# Patient Record
Sex: Male | Born: 2000 | Race: Black or African American | Hispanic: No | Marital: Single | Smoking: Never smoker
Health system: Southern US, Community
[De-identification: ages and names within clinical notes are randomized; demographics above are authoritative.]

## PROBLEM LIST (undated history)

## (undated) HISTORY — PX: HERNIA REPAIR: SHX51

---
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
## 2019-02-22 ENCOUNTER — Ambulatory Visit: Payer: Self-pay

## 2019-02-23 ENCOUNTER — Ambulatory Visit: Payer: Self-pay

## 2019-05-21 ENCOUNTER — Other Ambulatory Visit: Payer: Self-pay

## 2019-05-21 ENCOUNTER — Emergency Department
Admission: EM | Admit: 2019-05-21 | Discharge: 2019-05-21 | Disposition: A | Discharge status: Home / Self Care | Payer: Self-pay | Attending: Emergency Medicine | Admitting: Emergency Medicine

## 2019-05-21 ENCOUNTER — Encounter: Payer: Self-pay | Admitting: *Deleted

## 2019-05-21 ENCOUNTER — Emergency Department: Payer: Self-pay

## 2019-05-21 DIAGNOSIS — S43005A Unspecified dislocation of left shoulder joint, initial encounter: Secondary | ICD-10-CM

## 2019-05-21 DIAGNOSIS — S43015A Anterior dislocation of left humerus, initial encounter: Secondary | ICD-10-CM | POA: Insufficient documentation

## 2019-05-21 DIAGNOSIS — Y9361 Activity, american tackle football: Secondary | ICD-10-CM | POA: Insufficient documentation

## 2019-05-21 DIAGNOSIS — Y92321 Football field as the place of occurrence of the external cause: Secondary | ICD-10-CM | POA: Insufficient documentation

## 2019-05-21 DIAGNOSIS — X501XXA Overexertion from prolonged static or awkward postures, initial encounter: Secondary | ICD-10-CM | POA: Insufficient documentation

## 2019-05-21 DIAGNOSIS — Y999 Unspecified external cause status: Secondary | ICD-10-CM | POA: Insufficient documentation

## 2019-05-21 MED ORDER — MORPHINE SULFATE (PF) 4 MG/ML IV SOLN
4.0000 mg | Freq: Once | INTRAVENOUS | Status: AC
  Administered 2019-05-21: 4 mg via INTRAVENOUS
  Filled 2019-05-21: qty 1

## 2019-05-21 NOTE — ED Notes (Signed)
Patient transported to X-ray 

## 2019-05-21 NOTE — ED Notes (Signed)
MD at bedside. 

## 2019-05-21 NOTE — ED Notes (Signed)
Pt verbalized understanding of discharge instructions. NAD at this time. 

## 2019-05-21 NOTE — ED Triage Notes (Signed)
Pt to ED from home after playing football, falling and dislocating his left shoulder. Pt reports this has happened three times in the past but this is the first time he has not been able to relocate it on his own. 8/10 pain with decreased movement. Radial pulse intact and equal bilaterally. Color of arm is appropriate and cap refill < 3sec.

## 2019-05-21 NOTE — ED Provider Notes (Signed)
[facility] Emergency Department Provider Note   ____________________________________________   First MD Initiated Contact with Patient 05/21/19 1556     (approximate)  I have reviewed the triage vital signs and the nursing notes.   HISTORY  Chief Complaint Shoulder Injury    HPI Jeremiah Gomez is a 18 y.o. male with no significant past medical history who presents to the ED complaining of shoulder injury.  Patient reports that he was reaching with his left arm to swat a football out of the air when he felt a sudden pop.  It felt like the 3 prior occasions where he has dislocated his left shoulder, however in the past he has been able to relocate it on his own and was not able to do so today.  Initial injury occurred about 1 hour prior to arrival and he denies any significant pain anywhere other than his shoulder.  He has had full range of motion at his elbow and wrist and denies any numbness.        History reviewed. No pertinent past medical history.  Patient Active Problem List   Diagnosis Date Noted  . Inguinal hernia, left 02/07/2013    Past Surgical History:  Procedure Laterality Date  . HERNIA REPAIR     umbilical hernia baby    Prior to Admission medications   Not on File    Allergies Patient has no known allergies.  History reviewed. No pertinent family history.  Social History Social History   Tobacco Use  . Smoking status: Never Smoker  . Smokeless tobacco: Never Used  Substance Use Topics  . Alcohol use: No  . Drug use: No    Review of Systems  Constitutional: No fever/chills Eyes: No visual changes. ENT: No sore throat. Cardiovascular: Denies chest pain. Respiratory: Denies shortness of breath. Gastrointestinal: No abdominal pain.  No nausea, no vomiting.  No diarrhea.  No constipation. Genitourinary: Negative for dysuria. Musculoskeletal: Negative for back pain.  Positive for left shoulder pain and  dislocation. Skin: Negative for rash. Neurological: Negative for headaches, focal weakness or numbness.  ____________________________________________   PHYSICAL EXAM:  VITAL SIGNS: ED Triage Vitals  Enc Vitals Group     BP      Pulse      Resp      Temp      Temp src      SpO2      Weight      Height      Head Circumference      Peak Flow      Pain Score      Pain Loc      Pain Edu?      Excl. in GC?     Constitutional: Alert and oriented. Eyes: Conjunctivae are normal. Head: Atraumatic. Nose: No congestion/rhinnorhea. Mouth/Throat: Mucous membranes are moist. Neck: Normal ROM Cardiovascular: Normal rate, regular rhythm. Grossly normal heart sounds. Respiratory: Normal respiratory effort.  No retractions. Lungs CTAB. Gastrointestinal: Soft and nontender. No distention. Genitourinary: deferred Musculoskeletal: No lower extremity tenderness nor edema.  Obvious deformity of left shoulder.  Range of motion intact to left elbow and left wrist without tenderness. Neurologic:  Normal speech and language. No gross focal neurologic deficits are appreciated.  Strength and sensation intact at left elbow and left wrist. Skin:  Skin is warm, dry and intact. No rash noted. Psychiatric: Mood and affect are normal. Speech and behavior are normal.  ____________________________________________   LABS (all labs ordered are listed, but  only abnormal results are displayed)  Labs Reviewed - No data to display   PROCEDURES  Procedure(s) performed (including Critical Care):  Procedures   ____________________________________________   INITIAL IMPRESSION / ASSESSMENT AND PLAN / ED COURSE       18 year old male with history of prior left shoulder dislocations presents to the ED with obvious deformity to left shoulder after reaching to swat a football out of the air.  Deformity appears consistent with anterior dislocation of left shoulder.  He is neurovascularly intact distal to  the injury with good radial pulses and intact strength and sensation.  Will give pain control and x-ray left shoulder, attempt reduction.  Patient's initial x-rays showed anterior dislocation, which spontaneously reduced with positioning for additional films.  Follow-up x-rays confirm appropriate post reduction positioning with no evidence of fracture.  Patient was placed in left shoulder immobilizer and remains neurovascular intact.  He was counseled to follow-up with orthopedic surgery and to return to the ED for new or worsening symptoms, patient agrees with plan.      ____________________________________________   FINAL CLINICAL IMPRESSION(S) / ED DIAGNOSES  Final diagnoses:  Dislocation of left shoulder joint, initial encounter     ED Discharge Orders    None       Note:  This document was prepared using Dragon voice recognition software and may include unintentional dictation errors.   Blake Divine, MD 05/21/19 [phone]

## 2020-01-20 ENCOUNTER — Emergency Department
Admission: EM | Admit: 2020-01-20 | Discharge: 2020-01-20 | Disposition: A | Discharge status: Home / Self Care | Payer: No Typology Code available for payment source | Attending: Emergency Medicine | Admitting: Emergency Medicine

## 2020-01-20 ENCOUNTER — Other Ambulatory Visit: Payer: Self-pay

## 2020-01-20 ENCOUNTER — Emergency Department: Payer: No Typology Code available for payment source

## 2020-01-20 DIAGNOSIS — Y9389 Activity, other specified: Secondary | ICD-10-CM | POA: Diagnosis not present

## 2020-01-20 DIAGNOSIS — Y92410 Unspecified street and highway as the place of occurrence of the external cause: Secondary | ICD-10-CM | POA: Diagnosis not present

## 2020-01-20 DIAGNOSIS — Z23 Encounter for immunization: Secondary | ICD-10-CM | POA: Diagnosis not present

## 2020-01-20 DIAGNOSIS — M542 Cervicalgia: Secondary | ICD-10-CM | POA: Insufficient documentation

## 2020-01-20 DIAGNOSIS — S0990XA Unspecified injury of head, initial encounter: Secondary | ICD-10-CM | POA: Diagnosis not present

## 2020-01-20 DIAGNOSIS — Y998 Other external cause status: Secondary | ICD-10-CM | POA: Insufficient documentation

## 2020-01-20 DIAGNOSIS — R519 Headache, unspecified: Secondary | ICD-10-CM | POA: Diagnosis not present

## 2020-01-20 MED ORDER — TETANUS-DIPHTH-ACELL PERTUSSIS 5-2.5-18.5 LF-MCG/0.5 IM SUSP
0.5000 mL | Freq: Once | INTRAMUSCULAR | Status: AC
  Administered 2020-01-20: 0.5 mL via INTRAMUSCULAR
  Filled 2020-01-20: qty 0.5
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
## 2020-01-20 NOTE — ED Notes (Signed)
No peripheral IV placed this visit.   Discharge instructions reviewed with patient. Questions fielded by this RN. Patient verbalizes understanding of instructions. Patient discharged home in stable condition per Darl Pikes. No acute distress noted at time of discharge.

## 2020-01-20 NOTE — Discharge Instructions (Addendum)
Up with your regular doctor if not improving in 5 to 7 weeks.  And worsening headache or altered neurologic status please return the emergency department.

## 2020-05-13 ENCOUNTER — Emergency Department
Admission: EM | Admit: 2020-05-13 | Discharge: 2020-05-13 | Disposition: A | Discharge status: Home / Self Care | Payer: Medicaid Other | Attending: Student in an Organized Health Care Education/Training Program | Admitting: Student in an Organized Health Care Education/Training Program

## 2020-05-13 ENCOUNTER — Other Ambulatory Visit: Payer: Self-pay

## 2020-05-13 DIAGNOSIS — R109 Unspecified abdominal pain: Secondary | ICD-10-CM | POA: Insufficient documentation

## 2020-05-13 DIAGNOSIS — Z20822 Contact with and (suspected) exposure to covid-19: Secondary | ICD-10-CM | POA: Insufficient documentation

## 2020-05-13 DIAGNOSIS — K529 Noninfective gastroenteritis and colitis, unspecified: Secondary | ICD-10-CM

## 2020-05-13 LAB — COMPREHENSIVE METABOLIC PANEL
ALT: 15 U/L (ref 0–44)
AST: 22 U/L (ref 15–41)
Albumin: 4.8 g/dL (ref 3.5–5.0)
Alkaline Phosphatase: 70 U/L (ref 38–126)
Anion gap: 11 (ref 5–15)
BUN: 11 mg/dL (ref 6–20)
CO2: 27 mmol/L (ref 22–32)
Calcium: 9.4 mg/dL (ref 8.9–10.3)
Chloride: 102 mmol/L (ref 98–111)
Creatinine, Ser: 1.09 mg/dL (ref 0.61–1.24)
GFR, Estimated: >60 mL/min (ref >60)
Glucose, Bld: 110 mg/dL — ABNORMAL HIGH (ref 70–99)
Potassium: 3.9 mmol/L (ref 3.5–5.1)
Sodium: 140 mmol/L (ref 135–145)
Total Bilirubin: 1.6 mg/dL — ABNORMAL HIGH (ref 0.3–1.2)
Total Protein: 8.4 g/dL — ABNORMAL HIGH (ref 6.5–8.1)

## 2020-05-13 LAB — URINALYSIS, COMPLETE (UACMP) WITH MICROSCOPIC
Bacteria, UA: NONE SEEN
Bilirubin Urine: NEGATIVE
Glucose, UA: NEGATIVE mg/dL
Hgb urine dipstick: NEGATIVE
Ketones, ur: NEGATIVE mg/dL
Leukocytes,Ua: NEGATIVE
Nitrite: NEGATIVE
Protein, ur: NEGATIVE mg/dL
Specific Gravity, Urine: 1.026 (ref 1.005–1.030)
Squamous Epithelial / HPF: NONE SEEN (ref 0–5)
pH: 5 (ref 5.0–8.0)

## 2020-05-13 LAB — CBC
HCT: 47.7 % (ref 39.0–52.0)
Hemoglobin: 16.3 g/dL (ref 13.0–17.0)
MCH: 30.4 pg (ref 26.0–34.0)
MCHC: 34.2 g/dL (ref 30.0–36.0)
MCV: 89 fL (ref 80.0–100.0)
Platelets: 218 10*3/uL (ref 150–400)
RBC: 5.36 MIL/uL (ref 4.22–5.81)
RDW: 12.6 % (ref 11.5–15.5)
WBC: 13.2 10*3/uL — ABNORMAL HIGH (ref 4.0–10.5)
nRBC: 0 % (ref 0.0–0.2)

## 2020-05-13 LAB — LIPASE, BLOOD: Lipase: 29 U/L (ref 11–51)

## 2020-05-13 LAB — RESP PANEL BY RT-PCR (FLU A&B, COVID) ARPGX2
Influenza A by PCR: NEGATIVE
Influenza B by PCR: NEGATIVE
SARS Coronavirus 2 by RT PCR: NEGATIVE

## 2020-05-13 MED ORDER — ONDANSETRON 4 MG PO TBDP
4.0000 mg | ORAL_TABLET | Freq: Once | ORAL | Status: AC
  Administered 2020-05-13: 4 mg via ORAL
  Filled 2020-05-13: qty 1

## 2020-05-13 MED ORDER — ONDANSETRON HCL 4 MG PO TABS: 4.0000 mg | ORAL_TABLET | Freq: Three times a day (TID) | ORAL | 0 refills | Status: DC | PRN

## 2020-05-13 NOTE — ED Triage Notes (Signed)
Pt here with N/V/D that started today along with abd pain. Pt denies any other symptoms.

## 2020-05-13 NOTE — ED Provider Notes (Signed)
[facility] Emergency Department Provider Note ____________________________________________   First MD Initiated Contact with Patient 05/13/20 2223     (approximate)  I have reviewed the triage vital signs and the nursing notes.   HISTORY  Chief Complaint Emesis  HPI Jeremiah Gomez is a 19 y.o. male with no chronic medical history presents to the emergency department for treatment and evaluation of nausea, vomiting, and diarrhea that started earlier today.     No past medical history on file.  Patient Active Problem List   Diagnosis Date Noted  . Inguinal hernia, left 02/07/2013    Past Surgical History:  Procedure Laterality Date  . HERNIA REPAIR     umbilical hernia baby    Prior to Admission medications   Medication Sig Start Date End Date Taking? Authorizing Provider  ondansetron (ZOFRAN) 4 MG tablet Take 1 tablet (4 mg total) by mouth every 8 (eight) hours as needed for nausea or vomiting. 05/13/20   Chinita Pester, FNP    Allergies Patient has no known allergies.  No family history on file.  Social History Social History   Tobacco Use  . Smoking status: Never Smoker  . Smokeless tobacco: Never Used  Substance Use Topics  . Alcohol use: No  . Drug use: No    Review of Systems  Constitutional: No fever/chills Eyes: No visual changes. ENT: No sore throat. Cardiovascular: Denies chest pain. Respiratory: Denies shortness of breath. Gastrointestinal: Positive for abdominal cramping, nausea, vomiting, and diarrhea Genitourinary: Negative for dysuria. Musculoskeletal: Negative for back pain. Skin: Negative for rash. Neurological: Negative for headaches, focal weakness or numbness. ____________________________________________   PHYSICAL EXAM:  VITAL SIGNS: ED Triage Vitals  Enc Vitals Group     BP 05/13/20 1745 127/60     Pulse Rate 05/13/20 1745 83     Resp 05/13/20 1745 16     Temp 05/13/20 1745 98.3 F (36.8 C)      Temp Source 05/13/20 1745 Oral     SpO2 05/13/20 1745 100 %     Weight 05/13/20 1746 175 lb (79.4 kg)     Height 05/13/20 1746 5\' 11"  (1.803 m)     Head Circumference --      Peak Flow --      Pain Score 05/13/20 1746 9     Pain Loc --      Pain Edu? --      Excl. in GC? --     Constitutional: Alert and oriented. Well appearing and in no acute distress. Eyes: Conjunctivae are normal. Head: Atraumatic. Nose: No congestion/rhinnorhea. Mouth/Throat: Mucous membranes are moist.  Oropharynx non-erythematous. Neck: No stridor.   Hematological/Lymphatic/Immunilogical: No cervical lymphadenopathy. Cardiovascular: Normal rate, regular rhythm. Grossly normal heart sounds.  Good peripheral circulation. Respiratory: Normal respiratory effort.  No retractions. Lungs CTAB. Gastrointestinal: Soft and nontender. No distention. No abdominal bruits. Hyperactive bowel sounds x 4 quadrants. Genitourinary:  Musculoskeletal: No lower extremity tenderness nor edema.  No joint effusions. Neurologic:  Normal speech and language. No gross focal neurologic deficits are appreciated. No gait instability. Skin:  Skin is warm, dry and intact. No rash noted. Psychiatric: Mood and affect are normal. Speech and behavior are normal.  ____________________________________________   LABS (all labs ordered are listed, but only abnormal results are displayed)  Labs Reviewed  COMPREHENSIVE METABOLIC PANEL - Abnormal; Notable for the following components:      Result Value   Glucose, Bld 110 (*)    Total Protein 8.4 (*)  Total Bilirubin 1.6 (*)    All other components within normal limits  CBC - Abnormal; Notable for the following components:   WBC 13.2 (*)    All other components within normal limits  URINALYSIS, COMPLETE (UACMP) WITH MICROSCOPIC - Abnormal; Notable for the following components:   Color, Urine YELLOW (*)    APPearance CLEAR (*)    All other components within normal limits  RESP PANEL BY  RT-PCR (FLU A&B, COVID) ARPGX2  LIPASE, BLOOD   ____________________________________________  EKG  Not indicated. ____________________________________________  RADIOLOGY  ED MD interpretation:    Not indicated. I, Kem Boroughs, personally viewed and evaluated these images (plain radiographs) as part of my medical decision making, as well as reviewing the written report by the radiologist.  Official radiology report(s): No results found.  ____________________________________________   PROCEDURES  Procedure(s) performed (including Critical Care):  Procedures  ____________________________________________   INITIAL IMPRESSION / ASSESSMENT AND PLAN    19 year old male presenting to the emergency department for treatment and evaluation of abdominal cramping with nausea, vomiting, and diarrhea that started early today.  See HPI for further details.   DIFFERENTIAL DIAGNOSIS  Gastroenteritis, COVID-19, influenza, colitis  ED COURSE  Lab studies are all reassuring.  COVID-19 and influenza testing is negative.  While here, he was given Zofran with relief of symptoms.  He was able to tolerate fluids.  He will be discharged home with a prescription for Zofran and advised to start with clear liquids and gradually increase his normal diet. He was encouraged to return to the emergency department for symptoms change or worsen or for new concerns if he is unable to schedule an appointment with primary care    ___________________________________________   FINAL CLINICAL IMPRESSION(S) / ED DIAGNOSES  Final diagnoses:  Gastroenteritis     ED Discharge Orders         Ordered    ondansetron (ZOFRAN) 4 MG tablet  Every 8 hours PRN        05/13/20 2338           Misty Rago was evaluated in Emergency Department on 05/14/2020 for the symptoms described in the history of present illness. He was evaluated in the context of the global COVID-19 pandemic, which necessitated  consideration that the patient might be at risk for infection with the SARS-CoV-2 virus that causes COVID-19. Institutional protocols and algorithms that pertain to the evaluation of patients at risk for COVID-19 are in a state of rapid change based on information released by regulatory bodies including the CDC and federal and state organizations. These policies and algorithms were followed during the patient's care in the ED.   Note:  This document was prepared using Dragon voice recognition software and may include unintentional dictation errors.   Chinita Pester, FNP 05/14/20 1707    Gilles Chiquito, MD 05/15/20 1030

## 2021-03-19 ENCOUNTER — Other Ambulatory Visit: Payer: Self-pay

## 2021-03-19 ENCOUNTER — Ambulatory Visit: Payer: Self-pay | Admitting: Family Medicine

## 2021-03-19 ENCOUNTER — Encounter: Payer: Self-pay | Admitting: Family Medicine

## 2021-03-19 DIAGNOSIS — Z113 Encounter for screening for infections with a predominantly sexual mode of transmission: Secondary | ICD-10-CM

## 2021-03-19 DIAGNOSIS — Z202 Contact with and (suspected) exposure to infections with a predominantly sexual mode of transmission: Secondary | ICD-10-CM

## 2021-03-19 LAB — GRAM STAIN

## 2021-03-19 LAB — HM HEPATITIS C SCREENING LAB: HM Hepatitis Screen: NEGATIVE

## 2021-03-19 LAB — HM HIV SCREENING LAB: HM HIV Screening: NEGATIVE

## 2021-03-19 MED ORDER — AZITHROMYCIN 500 MG PO TABS
1000.0000 mg | ORAL_TABLET | Freq: Once | ORAL | Status: AC
  Administered 2021-03-19: 1000 mg via ORAL

## 2021-03-19 NOTE — Progress Notes (Signed)
[facility] Department STI clinic/screening visit  Subjective:  Jeremiah Gomez is a 20 y.o. male being seen today for an STI screening visit. The patient reports they do not have symptoms.    Patient has the following medical conditions:   Patient Active Problem List   Diagnosis Date Noted   Inguinal hernia, left 02/07/2013     Chief Complaint  Patient presents with   STD screening    HPI  Patient reports to clinic today for STD screening.  He also states that he is a contact to Chlamydia.  Does the patient or their partner desires a pregnancy in the next year? No  Screening for MPX risk: Does the patient have an unexplained rash? No Is the patient MSM? No Does the patient endorse multiple sex partners or anonymous sex partners? Yes Did the patient have close or sexual contact with a person diagnosed with MPX? No Has the patient traveled outside the [place] where MPX is endemic? No Is there a high clinical suspicion for MPX-- evidenced by one of the following No  -Unlikely to be chickenpox  -Lymphadenopathy  -Rash that present in same phase of evolution on any given body part   See flowsheet for further details and programmatic requirements.    The following portions of the patient's history were reviewed and updated as appropriate: allergies, current medications, past medical history, past social history, past surgical history and problem list.  Objective:  There were no vitals filed for this visit.  Physical Exam Constitutional:      Appearance: Normal appearance.  HENT:     Head: Normocephalic and atraumatic.     Mouth/Throat:     Comments: Poor dentition.    Pulmonary:     Effort: Pulmonary effort is normal.  Genitourinary:    Penis: Normal.      Testes: Normal.     Comments: Pubic area without nits, lice, hair loss, edema, erythema, lesions and inguinal adenopathy. Penis is without rash, lesions and discharge at meatus. Testicles descended  bilaterally,nt, no masses or edema.  Musculoskeletal:     Cervical back: Normal range of motion and neck supple.  Lymphadenopathy:     Cervical: Cervical adenopathy present.     Right cervical: Superficial cervical adenopathy present.     Left cervical: Superficial cervical adenopathy present.  Skin:    General: Skin is warm and dry.  Neurological:     Mental Status: He is alert and oriented to person, place, and time.  Psychiatric:        Mood and Affect: Mood normal.        Behavior: Behavior normal.      Assessment and Plan:  Jeremiah Gomez is a 20 y.o. male presenting to the [facility] Department for STI screening  1. Screening examination for venereal disease Patient does not have STI symptoms Patient accepted all screenings including gram stain, and oral for GC and bloodwork for HIV/RPR, Hep C, Hep B.  Patient meets criteria for HepB screening? Yes. Ordered? Yes Patient meets criteria for HepC screening? Yes. Ordered? Yes Recommended condom use with all sex Discussed importance of condom use for STI prevent  Treat gram stain per standing order.  Patient reports a compliance issue with taking pills.  Treated with Azithromycin 1 GM DOT as a contact to Chlamydia.   Discussed time line for State Lab results and that patient will be called with positive results and encouraged patient to call if he had not heard in  2 weeks Recommended returning for continued or worsening symptoms.   - Syphilis Serology, [place] [facility] - HIV/HCV [place] [facility] - HBV Antigen/Antibody State Lab - Gram stain - Gonococcus culture - Gonococcus culture   2. Exposure to chlamydia Patient treated as a contact to Chlamydia. Azithromycin given due to poor compliance with medication.   - azithromycin (ZITHROMAX) tablet 1,000 mg     No follow-ups on file.  No future appointments.  Glenna Fellows, NP

## 2021-03-19 NOTE — Progress Notes (Signed)
  Accompanied APP on pt visit.  Reviewed APP documentation for flowsheet and exam note.  Agree that documentation meets STD program requirements and /or guidelines and orders are placed appropriately .  Will order medication since not cleared to order medications yet.     Micaella Gitto, FNP  

## 2021-03-23 LAB — GONOCOCCUS CULTURE

## 2021-12-10 ENCOUNTER — Encounter: Payer: Self-pay | Admitting: Nurse Practitioner

## 2021-12-10 ENCOUNTER — Ambulatory Visit: Payer: Self-pay | Admitting: Nurse Practitioner

## 2021-12-10 DIAGNOSIS — Z113 Encounter for screening for infections with a predominantly sexual mode of transmission: Secondary | ICD-10-CM

## 2021-12-10 LAB — HM HEPATITIS C SCREENING LAB: HM Hepatitis Screen: NEGATIVE

## 2021-12-10 LAB — HM HIV SCREENING LAB: HM HIV Screening: NEGATIVE

## 2021-12-10 LAB — HEPATITIS B SURFACE ANTIGEN: Hepatitis B Surface Ag: NONREACTIVE

## 2021-12-10 NOTE — Progress Notes (Signed)
[facility] Department STI clinic/screening visit  Subjective:  Jeremiah Gomez is a 21 y.o. male being seen today for an STI screening visit. The patient reports they do not have symptoms.    Patient has the following medical conditions:   Patient Active Problem List   Diagnosis Date Noted   Inguinal hernia, left 02/07/2013     Chief Complaint  Patient presents with   SEXUALLY TRANSMITTED DISEASE    STI screening. Denies sx    HPI  Patient reports to clinic today for STD screening. Patient is asymptomatic.    Does the patient or their partner desires a pregnancy in the next year? No  Screening for MPX risk: Does the patient have an unexplained rash? No Is the patient MSM? No Does the patient endorse multiple sex partners or anonymous sex partners? No Did the patient have close or sexual contact with a person diagnosed with MPX? No Has the patient traveled outside the [place] where MPX is endemic? No Is there a high clinical suspicion for MPX-- evidenced by one of the following No  -Unlikely to be chickenpox  -Lymphadenopathy  -Rash that present in same phase of evolution on any given body part   See flowsheet for further details and programmatic requirements.   Immunization History  Administered Date(s) Administered   Tdap 01/20/2020     The following portions of the patient's history were reviewed and updated as appropriate: allergies, current medications, past medical history, past social history, past surgical history and problem list.  Objective:  There were no vitals filed for this visit.  Physical Exam Constitutional:      Appearance: Normal appearance.  HENT:     Head: Normocephalic. No abrasion, masses or laceration. Hair is normal.     Mouth/Throat:     Mouth: No oral lesions.     Dentition: Dental caries present.     Pharynx: No pharyngeal swelling, oropharyngeal exudate, posterior oropharyngeal erythema or uvula swelling.     Tonsils: No  tonsillar exudate or tonsillar abscesses.  Eyes:     General: Lids are normal.        Right eye: No discharge.        Left eye: No discharge.     Conjunctiva/sclera: Conjunctivae normal.     Right eye: No exudate.    Left eye: No exudate. Abdominal:     General: Abdomen is flat.     Palpations: Abdomen is soft.     Tenderness: There is no abdominal tenderness. There is no rebound.  Genitourinary:    Pubic Area: No rash or pubic lice.      Penis: Normal and uncircumcised. No erythema or discharge.      Testes: Normal.        Right: Mass or tenderness not present.        Left: Mass or tenderness not present.     Rectum: Normal.     Comments: Discharge amount: None  Color: None  Musculoskeletal:     Cervical back: Full passive range of motion without pain, normal range of motion and neck supple.  Lymphadenopathy:     Cervical: No cervical adenopathy.     Right cervical: No superficial, deep or posterior cervical adenopathy.    Left cervical: No superficial, deep or posterior cervical adenopathy.     Upper Body:     Right upper body: No supraclavicular, axillary or epitrochlear adenopathy.     Left upper body: No supraclavicular, axillary or epitrochlear adenopathy.  Lower Body: No right inguinal adenopathy. Left inguinal adenopathy present.  Skin:    Findings: No lesion or rash.  Neurological:     Mental Status: He is alert.  Psychiatric:        Behavior: Behavior is cooperative.       Assessment and Plan:  Jeremiah Gomez is a 21 y.o. male presenting to the [facility] Department for STI screening  1. Screening examination for venereal disease -21 year old mlae in clinic today for STD screening.  -Patient does not have STI symptoms Patient accepted all screenings including oral GC, urine CT/GC and bloodwork for HIV/RPR.  Patient meets criteria for HepB screening? Yes. Ordered? Yes Patient meets criteria for HepC screening? Yes. Ordered? Yes Recommended condom  use with all sex Discussed importance of condom use for STI prevent  Discussed time line for State Lab results and that patient will be called with positive results and encouraged patient to call if he had not heard in 2 weeks Recommended returning for continued or worsening symptoms.    - HIV/HCV [place] [facility] - Syphilis Serology, [place] [facility] - HBV Antigen/Antibody [facility] - Gonococcus culture - Chlamydia/GC NAA, Confirmation     Return if symptoms worsen or fail to improve.   Glenna Fellows, FNP

## 2021-12-10 NOTE — Progress Notes (Signed)
Pt instructed on 2-3 week interval for test results and has active MyChart. Verbalized understanding of pending labwork. Condoms declined. Lethea Killings RN

## 2021-12-12 LAB — CHLAMYDIA/GC NAA, CONFIRMATION
Chlamydia trachomatis, NAA: NEGATIVE
Neisseria gonorrhoeae, NAA: NEGATIVE

## 2021-12-14 LAB — GONOCOCCUS CULTURE
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
## 2022-06-10 ENCOUNTER — Encounter: Payer: Self-pay | Admitting: Nurse Practitioner

## 2022-06-10 ENCOUNTER — Ambulatory Visit: Payer: Medicaid Other | Admitting: Nurse Practitioner

## 2022-06-10 DIAGNOSIS — Z113 Encounter for screening for infections with a predominantly sexual mode of transmission: Secondary | ICD-10-CM

## 2022-06-10 LAB — HM HEPATITIS C SCREENING LAB: HM Hepatitis Screen: NEGATIVE

## 2022-06-10 LAB — HM HIV SCREENING LAB: HM HIV Screening: NEGATIVE

## 2022-06-10 NOTE — Progress Notes (Unsigned)
[facility] Department STI clinic/screening visit  Subjective:  Jeremiah Gomez is a 22 y.o. male being seen today for an STI screening visit. The patient reports they do not have symptoms.    Patient has the following medical conditions:   Patient Active Problem List   Diagnosis Date Noted   Inguinal hernia, left 02/07/2013     Chief Complaint  Patient presents with   SEXUALLY TRANSMITTED DISEASE    Screening- patient not having any symptoms     HPI  Patient reports to clinic today for STD screening.   Last HIV test per patient/review of record was: Lab Results  Component Value Date   HMHIVSCREEN Negative - Validated 12/10/2021    Does the patient or their partner desires a pregnancy in the next year? No  Screening for MPX risk: Does the patient have an unexplained rash? No Is the patient MSM? No Does the patient endorse multiple sex partners or anonymous sex partners? No Did the patient have close or sexual contact with a person diagnosed with MPX? No Has the patient traveled outside the [place] where MPX is endemic? No Is there a high clinical suspicion for MPX-- evidenced by one of the following No  -Unlikely to be chickenpox  -Lymphadenopathy  -Rash that present in same phase of evolution on any given body part   See flowsheet for further details and programmatic requirements.   Immunization History  Administered Date(s) Administered   HPV Quadrivalent 01/20/2012, 07/13/2012, 01/16/2013   Hepatitis A, Ped/Adol-2 Dose 01/20/2012, 12/20/2013   Hepatitis B, PED/ADOLESCENT 01-18-2000, 08/14/1999, 04/13/2000   MMR 07/29/2000, 07/16/2003   Meningococcal Conjugate 01/20/2012   Pneumococcal Conjugate PCV 7 09/19/1999, 11/13/1999, 02/11/2000, 07/29/2000   Tdap 01/20/2020   Varicella 01/17/2001, 01/20/2012     The following portions of the patient's history were reviewed and updated as appropriate: allergies, current medications, past medical history, past social  history, past surgical history and problem list.  Objective:  There were no vitals filed for this visit.  Physical Exam Constitutional:      Appearance: Normal appearance.  HENT:     Head: Normocephalic. No abrasion, masses or laceration. Hair is normal.     Right Ear: External ear normal.     Left Ear: External ear normal.     Nose: Nose normal.     Mouth/Throat:     Lips: Pink.     Mouth: Mucous membranes are moist. No oral lesions.     Pharynx: No pharyngeal swelling, oropharyngeal exudate, posterior oropharyngeal erythema or uvula swelling.     Tonsils: No tonsillar exudate or tonsillar abscesses.     Comments: Poor dentition  Eyes:     General: Lids are normal.        Right eye: No discharge.        Left eye: No discharge.     Conjunctiva/sclera: Conjunctivae normal.     Right eye: No exudate.    Left eye: No exudate. Pulmonary:     Effort: Pulmonary effort is normal.  Abdominal:     General: Abdomen is flat.     Palpations: Abdomen is soft.     Tenderness: There is no abdominal tenderness. There is no rebound.  Genitourinary:    Pubic Area: No rash or pubic lice.      Penis: Normal and uncircumcised. No erythema or discharge.      Testes: Normal.        Right: Mass or tenderness not present.  Left: Mass or tenderness not present.     Rectum: Normal.     Comments: Discharge amount: none Color: none  Musculoskeletal:     Cervical back: Full passive range of motion without pain, normal range of motion and neck supple.  Lymphadenopathy:     Cervical: No cervical adenopathy.     Right cervical: No superficial, deep or posterior cervical adenopathy.    Left cervical: No superficial, deep or posterior cervical adenopathy.     Upper Body:     Right upper body: No supraclavicular, axillary or epitrochlear adenopathy.     Left upper body: No supraclavicular, axillary or epitrochlear adenopathy.     Lower Body: No right inguinal adenopathy. No left inguinal  adenopathy.  Skin:    General: Skin is warm and dry.     Findings: No lesion or rash.  Neurological:     Mental Status: He is alert and oriented to person, place, and time.  Psychiatric:        Attention and Perception: Attention normal.        Mood and Affect: Mood normal.        Speech: Speech normal.        Behavior: Behavior normal. Behavior is cooperative.       Assessment and Plan:  Jeremiah Gomez is a 22 y.o. male presenting to the [place] for STI screening  1. Screening examination for venereal disease -22 year old male in clinic today for STD screening. -Patient does not have STI symptoms Patient accepted all screenings including oral GC, urine CT/GC and bloodwork for HIV/RPR.  Patient meets criteria for HepB screening? No. Ordered? No - low risk  Patient meets criteria for HepC screening? Yes. Ordered? Yes Recommended condom use with all sex Discussed importance of condom use for STI prevent  Treat gram stain per standing order Discussed time line for State Lab results and that patient will be called with positive results and encouraged patient to call if he had not heard in 2 weeks Recommended returning for continued or worsening symptoms.     - HIV/HCV [place] [facility] - Syphilis Serology, [place] [facility] - Gonococcus culture - Chlamydia/GC NAA, Confirmation   Total time spent: 20 minutes   Return if symptoms worsen or fail to improve.    Gregary Cromer, FNP

## 2022-06-12 LAB — CHLAMYDIA/GC NAA, CONFIRMATION
Chlamydia trachomatis, NAA: NEGATIVE
Neisseria gonorrhoeae, NAA: NEGATIVE

## 2022-06-15 LAB — GONOCOCCUS CULTURE

## 2023-03-07 ENCOUNTER — Emergency Department
Admission: EM | Admit: 2023-03-07 | Discharge: 2023-03-07 | Disposition: A | Discharge status: Home / Self Care | Payer: Medicaid Other | Attending: Emergency Medicine | Admitting: Emergency Medicine

## 2023-03-07 ENCOUNTER — Other Ambulatory Visit: Payer: Self-pay

## 2023-03-07 DIAGNOSIS — W01198A Fall on same level from slipping, tripping and stumbling with subsequent striking against other object, initial encounter: Secondary | ICD-10-CM | POA: Insufficient documentation

## 2023-03-07 DIAGNOSIS — S01511A Laceration without foreign body of lip, initial encounter: Secondary | ICD-10-CM | POA: Insufficient documentation

## 2023-03-07 DIAGNOSIS — S0990XA Unspecified injury of head, initial encounter: Secondary | ICD-10-CM | POA: Insufficient documentation

## 2023-03-07 DIAGNOSIS — Z23 Encounter for immunization: Secondary | ICD-10-CM | POA: Insufficient documentation

## 2023-03-07 MED ORDER — TETANUS-DIPHTH-ACELL PERTUSSIS 5-2.5-18.5 LF-MCG/0.5 IM SUSY
0.5000 mL | PREFILLED_SYRINGE | Freq: Once | INTRAMUSCULAR | Status: AC
  Administered 2023-03-07: 0.5 mL via INTRAMUSCULAR
  Filled 2023-03-07: qty 0.5

## 2023-03-07 MED ORDER — IBUPROFEN 800 MG PO TABS
800.0000 mg | ORAL_TABLET | Freq: Once | ORAL | Status: AC
  Administered 2023-03-07: 800 mg via ORAL
  Filled 2023-03-07: qty 1

## 2023-03-07 MED ORDER — CHLORHEXIDINE GLUCONATE 0.12 % MT SOLN
15.0000 mL | Freq: Two times a day (BID) | OROMUCOSAL | 0 refills | Status: AC
Start: 2023-03-07 — End: 2023-03-10

## 2023-03-07 MED ORDER — LIDOCAINE HCL (PF) 1 % IJ SOLN
5.0000 mL | Freq: Once | INTRAMUSCULAR | Status: AC
  Administered 2023-03-07: 5 mL
  Filled 2023-03-07: qty 5

## 2023-03-07 NOTE — ED Provider Notes (Signed)
[facility] Provider Note    Event Date/Time   First MD Initiated Contact with Patient 03/07/23 [id number]     (approximate)   History   Laceration   HPI  Jeremiah Gomez is a 22 y.o. male with no significant past medical history who presents to the emergency department with a upper left lip laceration that occurred just prior to arrival.  States he was horsing around with his brother when he fell and hit his head on a table.  He is not sure if there was loss of consciousness.  Denies any headache, neck or back pain, numbness, tingling or weakness.  Not on blood thinners.  Unsure of his last tetanus vaccine.  No dental pain.  No jaw tenderness.  Able to open and close mouth without difficulty.   History provided by patient, mother.    No past medical history on file.  Past Surgical History:  Procedure Laterality Date   HERNIA REPAIR     umbilical hernia baby    MEDICATIONS:  Prior to Admission medications   Medication Sig Start Date End Date Taking? Authorizing Provider  ondansetron (ZOFRAN) 4 MG tablet Take 1 tablet (4 mg total) by mouth every 8 (eight) hours as needed for nausea or vomiting. Patient not taking: Reported on 03/19/2021 05/13/20   Chinita Pester, FNP    Physical Exam   Triage Vital Signs: ED Triage Vitals  Encounter Vitals Group     BP 03/07/23 0045 124/71     Systolic BP Percentile --      Diastolic BP Percentile --      Pulse Rate 03/07/23 0045 84     Resp 03/07/23 0045 18     Temp 03/07/23 0045 98.2 F (36.8 C)     Temp Source 03/07/23 0045 Oral     SpO2 03/07/23 0045 96 %     Weight 03/07/23 0046 182 lb (82.6 kg)     Height 03/07/23 0046 6' (1.829 m)     Head Circumference --      Peak Flow --      Pain Score 03/07/23 0046 0     Pain Loc --      Pain Education --      Exclude from Growth Chart --     Most recent vital signs: Vitals:   03/07/23 0045 03/07/23 0216  BP: 124/71 130/73  Pulse: 84 (!) 52  Resp: 18 16   Temp: 98.2 F (36.8 C) 98.6 F (37 C)  SpO2: 96% 97%     CONSTITUTIONAL: Alert, responds appropriately to questions. Well-appearing; well-nourished; GCS 15 HEAD: Normocephalic; atraumatic EYES: Conjunctivae clear, PERRL, EOMI ENT: normal nose; no rhinorrhea; moist mucous membranes; pharynx without lesions noted; no dental injury; no septal hematoma, no epistaxis; no facial deformity or bony tenderness, 2.5 cm L-shaped laceration to the left upper lip that does cross the vermilion border but is not through and through.  There is also a small 1 cm laceration to the inside of the left upper lip. NECK: Supple, no midline spinal tenderness, step-off or deformity; trachea midline CARD: RRR; S1 and S2 appreciated; no murmurs, no clicks, no rubs, no gallops RESP: Normal chest excursion without splinting or tachypnea; breath sounds clear and equal bilaterally; no wheezes, no rhonchi, no rales; no hypoxia or respiratory distress CHEST:  chest wall stable, no crepitus or ecchymosis or deformity, nontender to palpation; no flail chest ABD/GI: Non-distended; soft, non-tender, no rebound, no guarding; no ecchymosis or other lesions  noted PELVIS:  stable, nontender to palpation BACK:  The back appears normal; no midline spinal tenderness, step-off or deformity EXT: Normal ROM in all joints; no edema; normal capillary refill; no cyanosis, no bony tenderness or bony deformity of patient's extremities, no joint effusions, compartments are soft, extremities are warm and well-perfused, no ecchymosis SKIN: Normal color for age and race; warm NEURO: No facial asymmetry, normal speech, moving all extremities equally  ED Results / Procedures / Treatments   LABS: (all labs ordered are listed, but only abnormal results are displayed) Labs Reviewed - No data to display   EKG:   RADIOLOGY: My personal review and interpretation of imaging:    I have personally reviewed all radiology reports. No results  found.   PROCEDURES:  Critical Care performed: No   CRITICAL CARE Performed by: Rochele Raring   Total critical care time: 0 minutes  Critical care time was exclusive of separately billable procedures and treating other patients.  Critical care was necessary to treat or prevent imminent or life-threatening deterioration.  Critical care was time spent personally by me on the following activities: development of treatment plan with patient and/or surrogate as well as nursing, discussions with consultants, evaluation of patient's response to treatment, examination of patient, obtaining history from patient or surrogate, ordering and performing treatments and interventions, ordering and review of laboratory studies, ordering and review of radiographic studies, pulse oximetry and re-evaluation of patient's condition.   Procedures   LACERATION REPAIR Performed by: Rochele Raring Authorized by: Rochele Raring Consent: Verbal consent obtained. Risks and benefits: risks, benefits and alternatives were discussed Consent given by: patient Patient identity confirmed: provided demographic data Prepped and Draped in normal sterile fashion Wound explored  Laceration Location: Left upper lip  Laceration Length: 2.5 cm  No Foreign Bodies seen or palpated  Anesthesia: local infiltration  Local anesthetic: lidocaine 1% without epinephrine  Anesthetic total: 4 ml  Irrigation method: syringe Amount of cleaning: standard  Skin closure: Simple interrupted  Number of sutures: 5  Technique: Area anesthetized using lidocaine 1% without epinephrine. Wound irrigated copiously with sterile saline. Wound then cleaned with Betadine and draped in sterile fashion. Wound closed using 5 sutures with 5-0 Vicryl.  Good wound approximation and hemostasis achieved.    Patient tolerance: Patient tolerated the procedure well with no immediate complications.    IMPRESSION / MDM / ASSESSMENT AND PLAN / ED  COURSE  I reviewed the triage vital signs and the nursing notes.  Patient here after a fall, head injury with lip laceration.     DIFFERENTIAL DIAGNOSIS (includes but not limited to):   Lip laceration, doubt facial fracture, intracranial hemorrhage, cervical spine fracture  Patient's presentation is most consistent with acute complicated illness / injury requiring diagnostic workup.  PLAN: Will repair lip.  Will update tetanus vaccine.  No other sign of traumatic injury on exam.  No indication for head, face or neck imaging at this time.   MEDICATIONS GIVEN IN ED: Medications  ibuprofen (ADVIL) tablet 800 mg (800 mg Oral Given 03/07/23 0119)  lidocaine (PF) (XYLOCAINE) 1 % injection 5 mL (5 mLs Other Given 03/07/23 0214)  Tdap (BOOSTRIX) injection 0.5 mL (0.5 mLs Intramuscular Given 03/07/23 0119)     ED COURSE: Patient continues to be neurologically intact, hemodynamically stable.  Lip laceration repaired at bedside.  Given the laceration of the inside of his mouth, will discharge with Peridex for the next 3 days and recommended soft diet.  Recommended Tylenol, Motrin as needed.  Discussed wound care instructions and return precautions.   At this time, I do not feel there is any life-threatening condition present. I reviewed all nursing notes, vitals, pertinent previous records.  All lab and urine results, EKGs, imaging ordered have been independently reviewed and interpreted by myself.  I reviewed all available radiology reports from any imaging ordered this visit.  Based on my assessment, I feel the patient is safe to be discharged home without further emergent workup and can continue workup as an outpatient as needed. Discussed all findings, treatment plan as well as usual and customary return precautions.  They verbalize understanding and are comfortable with this plan.  Outpatient follow-up has been provided as needed.  All questions have been answered.    CONSULTS:   none   OUTSIDE RECORDS REVIEWED: Reviewed last orthopedic note and December 2020.       FINAL CLINICAL IMPRESSION(S) / ED DIAGNOSES   Final diagnoses:  Lip laceration, initial encounter  Injury of head, initial encounter     Rx / DC Orders   ED Discharge Orders          Ordered    chlorhexidine (PERIDEX) 0.12 % solution  2 times daily        03/07/23 0217             Note:  This document was prepared using Dragon voice recognition software and may include unintentional dictation errors.   Airen Stiehl, Layla Maw, DO 03/07/23 [phone]

## 2023-03-07 NOTE — Discharge Instructions (Addendum)
You may alternate Tylenol 1000 mg every 6 hours as needed for pain, fever and Ibuprofen 800 mg every 6-8 hours as needed for pain, fever.  Please take Ibuprofen with food.  Do not take more than 4000 mg of Tylenol (acetaminophen) in a 24 hour period.   Your sutures are absorbable and will come out on their own in the next 1 to 2 weeks.   I recommend a soft diet for the next few days.  You may use your mouthwash twice a day for the next 3 days.

## 2023-03-07 NOTE — ED Triage Notes (Signed)
Pt reports he was playing with his friend, tripped and fell hitting the left side of his upper lip on the corner of TV stand. Pt talks in complete sentences no respiratory distress noted.

## 2023-05-17 ENCOUNTER — Encounter
Admission: EM | Disposition: A | Discharge status: Home / Self Care | Payer: Self-pay | Source: Home / Self Care | Attending: Emergency Medicine

## 2023-05-17 ENCOUNTER — Other Ambulatory Visit: Payer: Self-pay

## 2023-05-17 ENCOUNTER — Emergency Department: Payer: Self-pay | Admitting: Certified Registered"

## 2023-05-17 ENCOUNTER — Ambulatory Visit
Admission: EM | Admit: 2023-05-17 | Discharge: 2023-05-17 | Disposition: A | Discharge status: Home / Self Care | Payer: Self-pay | Attending: Emergency Medicine | Admitting: Emergency Medicine

## 2023-05-17 ENCOUNTER — Emergency Department: Payer: Self-pay

## 2023-05-17 DIAGNOSIS — E876 Hypokalemia: Secondary | ICD-10-CM

## 2023-05-17 DIAGNOSIS — N50812 Left testicular pain: Secondary | ICD-10-CM

## 2023-05-17 DIAGNOSIS — N4400 Torsion of testis, unspecified: Secondary | ICD-10-CM | POA: Insufficient documentation

## 2023-05-17 HISTORY — PX: SCROTAL EXPLORATION: SHX2386

## 2023-05-17 HISTORY — PX: ORCHIOPEXY: SHX479

## 2023-05-17 LAB — BASIC METABOLIC PANEL
Anion gap: 12 (ref 5–15)
BUN: 17 mg/dL (ref 6–20)
CO2: 26 mmol/L (ref 22–32)
Calcium: 8.9 mg/dL (ref 8.9–10.3)
Chloride: 98 mmol/L (ref 98–111)
Creatinine, Ser: 1.11 mg/dL (ref 0.61–1.24)
GFR, Estimated: >60 mL/min (ref >60)
Glucose, Bld: 146 mg/dL — ABNORMAL HIGH (ref 70–99)
Potassium: 2.9 mmol/L — ABNORMAL LOW (ref 3.5–5.1)
Sodium: 136 mmol/L (ref 135–145)

## 2023-05-17 LAB — CBC
HCT: 42.4 % (ref 39.0–52.0)
Hemoglobin: 14.5 g/dL (ref 13.0–17.0)
MCH: 30.1 pg (ref 26.0–34.0)
MCHC: 34.2 g/dL (ref 30.0–36.0)
MCV: 88.1 fL (ref 80.0–100.0)
Platelets: 267 10*3/uL (ref 150–400)
RBC: 4.81 MIL/uL (ref 4.22–5.81)
RDW: 12.3 % (ref 11.5–15.5)
WBC: 6.6 10*3/uL (ref 4.0–10.5)
nRBC: 0 % (ref 0.0–0.2)

## 2023-05-17 SURGERY — EXPLORATION, SCROTUM
Anesthesia: General | Laterality: Left

## 2023-05-17 MED ORDER — MIDAZOLAM HCL 2 MG/2ML IJ SOLN
INTRAMUSCULAR | Status: DC | PRN
  Administered 2023-05-17: 2 mg via INTRAVENOUS

## 2023-05-17 MED ORDER — PROPOFOL 10 MG/ML IV BOLUS
INTRAVENOUS | Status: DC | PRN
  Administered 2023-05-17: 200 mg via INTRAVENOUS

## 2023-05-17 MED ORDER — DEXAMETHASONE SODIUM PHOSPHATE 10 MG/ML IJ SOLN
INTRAMUSCULAR | Status: DC | PRN
  Administered 2023-05-17: 10 mg via INTRAVENOUS

## 2023-05-17 MED ORDER — ACETAMINOPHEN 10 MG/ML IV SOLN
INTRAVENOUS | Status: AC
  Filled 2023-05-17: qty 100

## 2023-05-17 MED ORDER — KETOROLAC TROMETHAMINE 30 MG/ML IJ SOLN
INTRAMUSCULAR | Status: DC | PRN
  Administered 2023-05-17: 30 mg via INTRAVENOUS

## 2023-05-17 MED ORDER — DEXMEDETOMIDINE HCL IN NACL 80 MCG/20ML IV SOLN
INTRAVENOUS | Status: DC | PRN
  Administered 2023-05-17: 8 ug via INTRAVENOUS
  Administered 2023-05-17: 4 ug via INTRAVENOUS
  Administered 2023-05-17: 8 ug via INTRAVENOUS

## 2023-05-17 MED ORDER — HYDROMORPHONE HCL 1 MG/ML IJ SOLN
1.0000 mg | Freq: Once | INTRAMUSCULAR | Status: AC
  Administered 2023-05-17: 1 mg via INTRAVENOUS
  Filled 2023-05-17: qty 1

## 2023-05-17 MED ORDER — OXYCODONE HCL 5 MG PO TABS
ORAL_TABLET | ORAL | Status: AC
  Filled 2023-05-17: qty 1

## 2023-05-17 MED ORDER — ROCURONIUM BROMIDE 10 MG/ML (PF) SYRINGE
PREFILLED_SYRINGE | INTRAVENOUS | Status: AC
  Filled 2023-05-17: qty 10

## 2023-05-17 MED ORDER — DEXAMETHASONE SODIUM PHOSPHATE 10 MG/ML IJ SOLN
INTRAMUSCULAR | Status: AC
  Filled 2023-05-17: qty 1

## 2023-05-17 MED ORDER — KETOROLAC TROMETHAMINE 30 MG/ML IJ SOLN
INTRAMUSCULAR | Status: AC
  Filled 2023-05-17: qty 1

## 2023-05-17 MED ORDER — CEFAZOLIN SODIUM-DEXTROSE 1-4 GM/50ML-% IV SOLN
1.0000 g | INTRAVENOUS | Status: AC
  Administered 2023-05-17: 1 g via INTRAVENOUS
  Filled 2023-05-17: qty 50

## 2023-05-17 MED ORDER — SENNOSIDES-DOCUSATE SODIUM 8.6-50 MG PO TABS: 1.0000 | ORAL_TABLET | Freq: Two times a day (BID) | ORAL | 0 refills | Status: AC

## 2023-05-17 MED ORDER — POTASSIUM CHLORIDE 10 MEQ/100ML IV SOLN
10.0000 meq | INTRAVENOUS | Status: AC
  Administered 2023-05-17: 10 meq via INTRAVENOUS
  Filled 2023-05-17: qty 100

## 2023-05-17 MED ORDER — OXYCODONE HCL 5 MG/5ML PO SOLN: 5.0000 mg | Freq: Once | ORAL | Status: AC | PRN

## 2023-05-17 MED ORDER — BUPIVACAINE HCL (PF) 0.25 % IJ SOLN
INTRAMUSCULAR | Status: DC | PRN
  Administered 2023-05-17: 30 mL

## 2023-05-17 MED ORDER — CEFAZOLIN SODIUM-DEXTROSE 1-4 GM/50ML-% IV SOLN
INTRAVENOUS | Status: AC
  Filled 2023-05-17: qty 50

## 2023-05-17 MED ORDER — SODIUM CHLORIDE 0.9 % IV SOLN: INTRAVENOUS | Status: DC

## 2023-05-17 MED ORDER — BUPIVACAINE HCL (PF) 0.25 % IJ SOLN
INTRAMUSCULAR | Status: AC
  Filled 2023-05-17: qty 30

## 2023-05-17 MED ORDER — LIDOCAINE HCL (CARDIAC) PF 100 MG/5ML IV SOSY
PREFILLED_SYRINGE | INTRAVENOUS | Status: DC | PRN
  Administered 2023-05-17: 100 mg via INTRAVENOUS

## 2023-05-17 MED ORDER — OXYCODONE HCL 5 MG PO TABS
5.0000 mg | ORAL_TABLET | Freq: Once | ORAL | Status: AC | PRN
  Administered 2023-05-17: 5 mg via ORAL

## 2023-05-17 MED ORDER — CEFAZOLIN (ANCEF) 1 G IV SOLR: 1.0000 g | INTRAVENOUS | Status: DC

## 2023-05-17 MED ORDER — LIDOCAINE HCL (PF) 2 % IJ SOLN
INTRAMUSCULAR | Status: AC
  Filled 2023-05-17: qty 5

## 2023-05-17 MED ORDER — OXYCODONE HCL 5 MG PO TABS: 5.0000 mg | ORAL_TABLET | Freq: Four times a day (QID) | ORAL | 0 refills | Status: AC | PRN

## 2023-05-17 MED ORDER — FENTANYL CITRATE (PF) 100 MCG/2ML IJ SOLN: 25.0000 ug | INTRAMUSCULAR | Status: DC | PRN

## 2023-05-17 MED ORDER — ONDANSETRON HCL 4 MG/2ML IJ SOLN
4.0000 mg | Freq: Once | INTRAMUSCULAR | Status: AC
  Administered 2023-05-17: 4 mg via INTRAVENOUS
  Filled 2023-05-17: qty 2

## 2023-05-17 MED ORDER — FENTANYL CITRATE (PF) 100 MCG/2ML IJ SOLN
INTRAMUSCULAR | Status: DC | PRN
  Administered 2023-05-17 (×2): 50 ug via INTRAVENOUS

## 2023-05-17 MED ORDER — ONDANSETRON HCL 4 MG/2ML IJ SOLN
INTRAMUSCULAR | Status: DC | PRN
  Administered 2023-05-17: 4 mg via INTRAVENOUS

## 2023-05-17 MED ORDER — ROCURONIUM BROMIDE 100 MG/10ML IV SOLN
INTRAVENOUS | Status: DC | PRN
  Administered 2023-05-17: 50 mg via INTRAVENOUS

## 2023-05-17 MED ORDER — MIDAZOLAM HCL 2 MG/2ML IJ SOLN
INTRAMUSCULAR | Status: AC
  Filled 2023-05-17: qty 2

## 2023-05-17 MED ORDER — ACETAMINOPHEN 10 MG/ML IV SOLN
INTRAVENOUS | Status: DC | PRN
  Administered 2023-05-17: 1000 mg via INTRAVENOUS

## 2023-05-17 MED ORDER — SUGAMMADEX SODIUM 200 MG/2ML IV SOLN
INTRAVENOUS | Status: DC | PRN
  Administered 2023-05-17: 335.6 mg via INTRAVENOUS

## 2023-05-17 MED ORDER — PROPOFOL 10 MG/ML IV BOLUS
INTRAVENOUS | Status: AC
  Filled 2023-05-17: qty 20

## 2023-05-17 MED ORDER — ONDANSETRON HCL 4 MG/2ML IJ SOLN
INTRAMUSCULAR | Status: AC
  Filled 2023-05-17: qty 2

## 2023-05-17 MED ORDER — FENTANYL CITRATE (PF) 100 MCG/2ML IJ SOLN
INTRAMUSCULAR | Status: AC
  Filled 2023-05-17: qty 2

## 2023-05-17 SURGICAL SUPPLY — 41 items
BLADE CLIPPER SURG (BLADE) ×2 IMPLANT
BNDG COHESIVE 3X5 WHT NS (GAUZE/BANDAGES/DRESSINGS) ×2 IMPLANT
BNDG GAUZE DERMACEA FLUFF 4 (GAUZE/BANDAGES/DRESSINGS) IMPLANT
DRAIN PENROSE 12X.25 LTX STRL (MISCELLANEOUS) ×2 IMPLANT
DRAPE LAPAROTOMY 100X77 ABD (DRAPES) ×2 IMPLANT
DRSG GAUZE FLUFF 36X18 (GAUZE/BANDAGES/DRESSINGS) ×2 IMPLANT
DRSG TELFA 3X4 N-ADH STERILE (GAUZE/BANDAGES/DRESSINGS) ×2 IMPLANT
ELECT CAUTERY BLADE 6.4 (BLADE) ×2 IMPLANT
ELECT REM PT RETURN 9FT ADLT (ELECTROSURGICAL) ×2
ELECTRODE REM PT RTRN 9FT ADLT (ELECTROSURGICAL) ×2 IMPLANT
GAUZE SPONGE 4X4 12PLY STRL (GAUZE/BANDAGES/DRESSINGS) ×2 IMPLANT
GLOVE BIO SURGEON STRL SZ7.5 (GLOVE) ×2 IMPLANT
GOWN STRL REUS W/ TWL LRG LVL3 (GOWN DISPOSABLE) ×4 IMPLANT
KIT TURNOVER KIT A (KITS) ×2 IMPLANT
LABEL OR SOLS (LABEL) ×2 IMPLANT
MANIFOLD NEPTUNE II (INSTRUMENTS) ×2 IMPLANT
NDL HYPO 22X1.5 SAFETY MO (MISCELLANEOUS) ×2 IMPLANT
NDL HYPO 25X1 1.5 SAFETY (NEEDLE) ×2 IMPLANT
NEEDLE HYPO 22X1.5 SAFETY MO (MISCELLANEOUS) ×2 IMPLANT
NEEDLE HYPO 25X1 1.5 SAFETY (NEEDLE) ×2 IMPLANT
NS IRRIG 500ML POUR BTL (IV SOLUTION) IMPLANT
PACK BASIN MINOR ARMC (MISCELLANEOUS) ×2 IMPLANT
PAD ABD DERMACEA PRESS 5X9 (GAUZE/BANDAGES/DRESSINGS) IMPLANT
SOL PREP PVP 2OZ (MISCELLANEOUS) ×2
SOLUTION PREP PVP 2OZ (MISCELLANEOUS) ×2 IMPLANT
SPONGE KITTNER 5P (MISCELLANEOUS) ×2 IMPLANT
SUPPORT SCROTAL MED ADLT STRP (MISCELLANEOUS) ×2 IMPLANT
SUT ETHILON 3 0 PS 1 (SUTURE) ×2 IMPLANT
SUT ETHILON 3-0 FS-10 30 BLK (SUTURE) ×2
SUT MNCRL 4-0 27XMFL (SUTURE) ×2
SUT MNCRL AB 4-0 PS2 18 (SUTURE) ×2 IMPLANT
SUT PROLENE 4 0 PS 2 18 (SUTURE) IMPLANT
SUT PROLENE 4-0 RB1 .5 CRCL 36 (SUTURE) ×2 IMPLANT
SUT SILK 0 30XBRD TIE 6 (SUTURE) ×2 IMPLANT
SUT VIC AB 3-0 SH 27X BRD (SUTURE) ×2 IMPLANT
SUT VIC AB 4-0 SH 27XANBCTRL (SUTURE) ×2 IMPLANT
SUTURE EHLN 3-0 FS-10 30 BLK (SUTURE) ×2 IMPLANT
SUTURE MNCRL 4-0 27XMF (SUTURE) IMPLANT
SYR 10ML LL (SYRINGE) ×2 IMPLANT
TOWEL OR 17X26 4PK STRL BLUE (TOWEL DISPOSABLE) ×2 IMPLANT
TRAP FLUID SMOKE EVACUATOR (MISCELLANEOUS) ×2 IMPLANT

## 2023-05-17 NOTE — Discharge Instructions (Addendum)
1 -  All stitches are dissolvable and will disappear in about 3 weeks. Some bloody drainage IS expected from drain area x few days. OK to shower at anytime. No tub bathing / swimming x 2 weeks. No active sexual stimulation x 2 weeks.   2 - Call MD or go to ER for fever >102, severe pain / nausea / vomiting not relieved by medications, or acute change in medical status

## 2023-05-17 NOTE — ED Provider Notes (Signed)
[facility] Provider Note    Event Date/Time   First MD Initiated Contact with Patient 05/17/23 0522     (approximate)   History   No chief complaint on file.   HPI  Jeremiah Gomez is a 22 y.o. male with previous history of umbilical hernia repair who presents to the emergency department with sudden onset left-sided testicular pain.  States he woke up from sleep with it about 1 hour prior to arrival around 4:30 AM.  States he went to bed at 1 AM in his normal state of health.  No injury to this area.  No dysuria or hematuria.  No penile discharge.  States he is worried he has a testicular torsion.  No fever.   History provided by patient, significant other.    History reviewed. No pertinent past medical history.  Past Surgical History:  Procedure Laterality Date   HERNIA REPAIR     umbilical hernia baby    MEDICATIONS:  Prior to Admission medications   Medication Sig Start Date End Date Taking? Authorizing Provider  ondansetron (ZOFRAN) 4 MG tablet Take 1 tablet (4 mg total) by mouth every 8 (eight) hours as needed for nausea or vomiting. Patient not taking: Reported on 03/19/2021 05/13/20   Chinita Pester, FNP    Physical Exam   Triage Vital Signs: ED Triage Vitals  Encounter Vitals Group     BP 05/17/23 0514 (!) 146/126     Systolic BP Percentile --      Diastolic BP Percentile --      Pulse Rate 05/17/23 0514 (!) 49     Resp 05/17/23 0514 20     Temp 05/17/23 0514 97.6 F (36.4 C)     Temp Source 05/17/23 0514 Oral     SpO2 05/17/23 0514 100 %     Weight 05/17/23 0516 185 lb (83.9 kg)     Height 05/17/23 0516 6' (1.829 m)     Head Circumference --      Peak Flow --      Pain Score 05/17/23 0516 10     Pain Loc --      Pain Education --      Exclude from Growth Chart --     Most recent vital signs: Vitals:   05/17/23 0535 05/17/23 0600  BP: (!) 141/88 131/86  Pulse: (!) 51 (!) 44  Resp: 20 11  Temp:    SpO2: 97% 98%     CONSTITUTIONAL: Alert, responds appropriately to questions.  Peers uncomfortable HEAD: Normocephalic, atraumatic EYES: Conjunctivae clear, pupils appear equal, sclera nonicteric ENT: normal nose; moist mucous membranes NECK: Supple, normal ROM CARD: RRR; S1 and S2 appreciated RESP: Normal chest excursion without splinting or tachypnea; breath sounds clear and equal bilaterally; no wheezes, no rhonchi, no rales, no hypoxia or respiratory distress, speaking full sentences ABD/GI: Non-distended; soft, non-tender, no rebound, no guarding, no peritoneal signs GU:  Normal external genitalia, circumcised male, normal penile shaft, no blood or discharge at the urethral meatus, no testicular masses or tenderness on exam on the right, no scrotal masses or swelling, no hernias appreciated, 2+ femoral pulses bilaterally; no perineal erythema, warmth, subcutaneous air or crepitus; tender to palpation over the left testicle.  Testicle is high riding without cremasteric reflex.  Chaperone present. BACK: The back appears normal EXT: Normal ROM in all joints; no deformity noted, no edema SKIN: Normal color for age and race; warm; no rash on exposed skin NEURO: Moves all extremities  equally, normal speech PSYCH: The patient's mood and manner are appropriate.   ED Results / Procedures / Treatments   LABS: (all labs ordered are listed, but only abnormal results are displayed) Labs Reviewed  BASIC METABOLIC PANEL - Abnormal; Notable for the following components:      Result Value   Potassium 2.9 (*)    Glucose, Bld 146 (*)    All other components within normal limits  CHLAMYDIA/NGC RT PCR [facility] ONLY)            CBC  URINALYSIS, ROUTINE W REFLEX MICROSCOPIC  TYPE AND SCREEN     EKG:   EKG Interpretation Date/Time:  Monday May 17 2023 06:01:45 EST Ventricular Rate:  44 PR Interval:  203 QRS Duration:  94 QT Interval:  442 QTC Calculation: 379 R Axis:   90  Text Interpretation: Sinus  bradycardia RSR' in V1 or V2, probably normal variant Benign early repolarization No old tracing to compare Confirmed by Rochele Raring [phone]) on 05/17/2023 6:03:46 AM        RADIOLOGY: My personal review and interpretation of imaging: ReSound shows left testicular torsion.  I have personally reviewed all radiology reports.   US SCROTUM W/DOPPLER  Result Date: 05/17/2023 CLINICAL DATA:  22 year old male with history of acute onset of left-sided testicular pain for the past 2 hours. EXAM: SCROTAL ULTRASOUND DOPPLER ULTRASOUND OF THE TESTICLES TECHNIQUE: Complete ultrasound examination of the testicles, epididymis, and other scrotal structures was performed. Color and spectral Doppler ultrasound were also utilized to evaluate blood flow to the testicles. COMPARISON:  No priors. FINDINGS: Right testicle Measurements: 6.3 x 2.8 x 3.7 cm. No mass or microlithiasis visualized. Left testicle Measurements: 4.4 x 2.2 x 3.5 cm. No mass or microlithiasis visualized. Right epididymis:  Normal in size and appearance. Left epididymis: Normal in size. Normal blood flow could not be visualized. Hydrocele:  Trace bilateral hydroceles. Varicocele:  None visualized. Pulsed Doppler interrogation of both testes demonstrates normal low resistance arterial and venous waveforms to the right testicle. However, normal flow could not be visualized to the left testicle or low left epididymis. IMPRESSION: 1. Study is positive for left-sided testicular torsion. Lack of blood flow also noted to the left epididymis. Critical Value/emergent results were called by telephone at the time of interpretation on 05/17/2023 at 6:06 am to provider [facility], who verbally acknowledged these results. Electronically Signed   By: Trudie Reed M.D.   On: 05/17/2023 06:08     PROCEDURES:  Critical Care performed: Yes, see critical care procedure note(s)   CRITICAL CARE Performed by: Baxter Hire Husain Costabile   Total critical care time: 40  minutes  Critical care time was exclusive of separately billable procedures and treating other patients.  Critical care was necessary to treat or prevent imminent or life-threatening deterioration.  Critical care was time spent personally by me on the following activities: development of treatment plan with patient and/or surrogate as well as nursing, discussions with consultants, evaluation of patient's response to treatment, examination of patient, obtaining history from patient or surrogate, ordering and performing treatments and interventions, ordering and review of laboratory studies, ordering and review of radiographic studies, pulse oximetry and re-evaluation of patient's condition.   Marland Kitchen1-3 Lead EKG Interpretation  Performed by: Wentworth Edelen, Layla Maw, DO Authorized by: Bracha Frankowski, Layla Maw, DO     Interpretation: abnormal     ECG rate:  49   ECG rate assessment: bradycardic     Rhythm: sinus bradycardia     Ectopy: none  Conduction: normal       IMPRESSION / MDM / ASSESSMENT AND PLAN / ED COURSE  I reviewed the triage vital signs and the nursing notes.    Patient here with sudden onset left-sided testicular pain.  The patient is on the cardiac monitor to evaluate for evidence of arrhythmia and/or significant heart rate changes.   DIFFERENTIAL DIAGNOSIS (includes but not limited to):   Testicular torsion, orchitis, epididymitis, UTI, STI, kidney stone   Patient's presentation is most consistent with acute presentation with potential threat to life or bodily function.   PLAN: I am very concerned about testicular torsion given his presentation.  I have called the ultrasound technician to come get his ultrasound done emergently.  Patient was seen immediately after he was brought back to a room from triage.  Will obtain labs, urine.  Will give IV fluids, pain and nausea medicine.  Will keep NPO.  He has been n.p.o. since 10 PM last night.   MEDICATIONS GIVEN IN ED: Medications   0.9 %  sodium chloride infusion ( Intravenous New Bag/Given 05/17/23 0558)  ceFAZolin (ANCEF) IVPB 1 g/50 mL premix (has no administration in time range)  potassium chloride 10 mEq in 100 mL IVPB (has no administration in time range)  HYDROmorphone (DILAUDID) injection 1 mg (1 mg Intravenous Given 05/17/23 0529)  ondansetron (ZOFRAN) injection 4 mg (4 mg Intravenous Given 05/17/23 0530)  HYDROmorphone (DILAUDID) injection 1 mg (1 mg Intravenous Given 05/17/23 0618)     ED COURSE:  5:59 AM  Confirmed with ultrasonographer that patient has no blood flow to the left testicle.  She will contact radiology for stat read.  I will contact urology.   6:06 AM  Spoke with Dr. Llana Aliment with radiology who confirms acute left testicular torsion.  6:17 AM  Pt's labs show no leukocytosis, normal hemoglobin.  Potassium of 2.9 without EKG changes.  He is bradycardic but suspect that this is due to him being young, healthy.  He is not having any chest pain or shortness of breath.  Will give IV replacement for potassium as he is n.p.o. right now.  CONSULTS: 6:08 AM Urology consulted for acute LEFT testicular torsion.  Spoke with Dr. Berneice Heinrich.  Appreciate his help.  Patient will go to the operating room.  He remains NPO.  Will give additional pain meds.   OUTSIDE RECORDS REVIEWED: Reviewed last general surgery note in 2014 for left inguinal hernia.       FINAL CLINICAL IMPRESSION(S) / ED DIAGNOSES   Final diagnoses:  Left testicular pain  Left testicular torsion  Hypokalemia     Rx / DC Orders   ED Discharge Orders     None        Note:  This document was prepared using Dragon voice recognition software and may include unintentional dictation errors.   Marieelena Bartko, Layla Maw, DO 05/17/23 [phone]

## 2023-05-17 NOTE — ED Notes (Addendum)
Patient aware of need for urine sample. Urinal at bedside and call button within reach.   [place] tech at bedside for exam at this time. Pt is completely undressed and changed into hospital gown.

## 2023-05-17 NOTE — Transfer of Care (Signed)
Immediate Anesthesia Transfer of Care Note  Patient: Jeremiah Gomez  Procedure(s) Performed: Procedure(s): SCROTUM EXPLORATION (Left) ORCHIOPEXY ADULT (Bilateral)  Patient Location: PACU  Anesthesia Type:General  Level of Consciousness: sedated  Airway & Oxygen Therapy: Patient Spontanous Breathing and Patient connected to face mask oxygen  Post-op Assessment: Report given to RN and Post -op Vital signs reviewed and stable  Post vital signs: Reviewed and stable  Last Vitals:  Vitals:   05/17/23 0701 05/17/23 0800  BP: (!) 158/82 (!) 114/49  Pulse: 61   Resp: 16 15  Temp: 36.8 C (!) 36.3 C  SpO2: 100% 100%    Complications: No apparent anesthesia complications

## 2023-05-17 NOTE — ED Triage Notes (Signed)
Pt presents to ER with c/o left testicle pain and swelling that started suddenly while pt was sleeping.  Pt reports feeling like his left testicle is "twisted."  Pt in visible discomfort in triage.  Denies any trauma to testicles.  Pt otherwise A&O x4.

## 2023-05-17 NOTE — Op Note (Signed)
NAMEJADAKISS, KUMP MEDICAL RECORD NO: [id number] ACCOUNT NO: [id number] DATE OF BIRTH: May 11, 2001 FACILITY: [facility] LOCATION: [facility] PHYSICIAN: Sebastian Ache, MD  Operative Report   DATE OF PROCEDURE: 05/17/2023  PREOPERATIVE DIAGNOSIS:  Left testicular torsion.  PROCEDURE PERFORMED:  Scrotal exploration, left testicular detorsion and bilateral orchiopexy.  ESTIMATED BLOOD LOSS:  Nil.  COMPLICATIONS:  None.  SPECIMEN:  None.  DRAIN:  Penrose drain to dependent wound drainage.  FINDINGS: 1.  Left testicular intratunical torsion, two revolutions. 2.  Return of triphasic blood flow on Doppler following detorsion. 3.  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  No torsion on the right side. 4.  Successful bilateral orchiopexy.  INDICATIONS:  The patient is a 22 year old young man who was found on workup of acute onset of left scrotal pain upon waking this morning to have acute left testicular torsion.  Emergent consultation was sought.  I reviewed his imaging and felt that  emergent operative intervention with left detorsion and bilateral pexy was warranted.  He wished to proceed.  Informed consent was obtained and placed in medical record.  PROCEDURE IN DETAIL:  The patient being verified, procedure being scrotal exploration with left detorsion and bilateral pexy was confirmed.  Procedure timeout was performed.  Intravenous antibiotics administered.  General endotracheal anesthesia induced.   The patient was placed in the supine position.  Sterile field was created by first clipper shaving and prepping and draping the patient's penis, perineum and proximal thighs using iodine.  The patient's testis remained left high riding.  Incision was  made approximately 4.5 cm in length along the median raphe directly into the left scrotal compartment.  Dissection was carried down to the tunics.  The left testicle was delivered into the operative field.  There was intratunical torsion on the  left  side.  Two revolutions noted.  The cord was straightened and detorsed with the lateral sulcus being lateral.  Doppler ultrasound was performed which corroborated triphasic blood flow to the antimesenteric border of the testis, confirming successful  detorsion.  Left testis was then pexed using 4-0 Prolene x2 on the lateral side to the inner lateral scrotal leaflet, taking care to avoid skin perforation.  This revealed an excellent pexy on the left side.  Via the same incision, the right scrotal compartment was  entered.  The right testis was delivered.  It also too had a bell clapper type deformity.  Appendix testis was ablated bilaterally.  There was no evidence of torsion on the right side.  The right testis was somewhat larger than the left suggesting  possibly intermittent torsion on the left previously.  Lateral sulcus was lateral and the right testis was pexed x2 as per the left.  A small counter incision was made in the dependent scrotum through which a quarter-inch Penrose drain was placed such  that it traversed both scrotal compartments.  The dartos was reapproximated using running Vicryl.  Skin was reapproximated using running Monocryl.  Drain stitch was applied of air knot of nylon followed by  dressing of ABD with fluffs and mesh underwear was placed.   Procedure was terminated.  The patient tolerated the procedure well.  No immediate perioperative complications.  The patient taken to postanesthesia care in stable condition.  Plan for discharge home.   SHW D: 05/17/2023 7:57:09 am T: 05/17/2023 9:02:00 am  JOB: [id number]/ [id number]

## 2023-05-17 NOTE — Brief Op Note (Signed)
05/17/2023  7:51 AM  PATIENT:  Jeremiah Gomez  22 y.o. male  PRE-OPERATIVE DIAGNOSIS:  Left testicular torsion  POST-OPERATIVE DIAGNOSIS:  S/P left scrotal orchiopexy to repair torsion  PROCEDURE:  Procedure(s): SCROTUM EXPLORATION (Left) ORCHIOPEXY ADULT (Bilateral)  SURGEON:  Surgeons and Role:    * Juwana Thoreson, Delbert Phenix., MD - Primary  PHYSICIAN ASSISTANT:   ASSISTANTS: none   ANESTHESIA:   local and general  EBL:  minimal   BLOOD ADMINISTERED:none  DRAINS: Penrose drain in the dependant scrotum    LOCAL MEDICATIONS USED:  MARCAINE     SPECIMEN:  No Specimen  DISPOSITION OF SPECIMEN:  N/A  COUNTS:  YES  TOURNIQUET:  * No tourniquets in log *  DICTATION: .Other Dictation: Dictation Number [id number]  PLAN OF CARE: Discharge to home after PACU  PATIENT DISPOSITION:  PACU - hemodynamically stable.   Delay start of Pharmacological VTE agent (>24hrs) due to surgical blood loss or risk of bleeding: yes

## 2023-05-17 NOTE — Interval H&P Note (Signed)
History and Physical Interval Note:  05/17/2023 7:02 AM  Jeremiah Gomez  has presented today for surgery, with the diagnosis of Left testicular tosion.  The various methods of treatment have been discussed with the patient and family. After consideration of risks, benefits and other options for treatment, the patient has consented to  Procedure(s): SCROTUM EXPLORATION (Left) ORCHIOPEXY ADULT (Bilateral) as a surgical intervention.  The patient's history has been reviewed, patient examined, no change in status, stable for surgery.  I have reviewed the patient's chart and labs.  Questions were answered to the patient's satisfaction.     [name].

## 2023-05-17 NOTE — Anesthesia Procedure Notes (Signed)
Procedure Name: Intubation Date/Time: 05/17/2023 7:24 AM  Performed by: Stormy Fabian, CRNAPre-anesthesia Checklist: Patient identified, Patient being monitored, Timeout performed, Emergency Drugs available and Suction available Patient Re-evaluated:Patient Re-evaluated prior to induction Oxygen Delivery Method: Circle system utilized Preoxygenation: Pre-oxygenation with 100% oxygen Induction Type: IV induction Ventilation: Mask ventilation without difficulty Laryngoscope Size: Mac, 3 and McGrath Grade View: Grade I Tube type: Oral Tube size: 7.5 mm Number of attempts: 1 Airway Equipment and Method: Stylet Placement Confirmation: ETT inserted through vocal cords under direct vision, positive ETCO2 and breath sounds checked- equal and bilateral Secured at: 23 cm Tube secured with: Tape Dental Injury: Teeth and Oropharynx as per pre-operative assessment

## 2023-05-17 NOTE — Anesthesia Postprocedure Evaluation (Signed)
Anesthesia Post Note  Patient: Jeremiah Gomez  Procedure(s) Performed: SCROTUM EXPLORATION (Left) ORCHIOPEXY ADULT (Bilateral)  Patient location during evaluation: PACU Anesthesia Type: General Level of consciousness: awake and alert Pain management: pain level controlled Vital Signs Assessment: post-procedure vital signs reviewed and stable Respiratory status: spontaneous breathing, nonlabored ventilation, respiratory function stable and patient connected to nasal cannula oxygen Cardiovascular status: blood pressure returned to baseline and stable Postop Assessment: no apparent nausea or vomiting Anesthetic complications: no  No notable events documented.   Last Vitals:  Vitals:   05/17/23 0845 05/17/23 0846  BP: (!) 115/56 (!) 115/56  Pulse: (!) 50 71  Resp: 13 15  Temp:  36.6 C  SpO2: 98% 100%    Last Pain:  Vitals:   05/17/23 0846  TempSrc:   PainSc: 0-No pain                 Stephanie Coup

## 2023-05-17 NOTE — H&P (Signed)
Jeremiah Gomez is an 22 y.o. male.    Chief Complaint: LEFT Testicular Torsion  HPI:   1 - LEFT Testicular Torsion - acute onsent left groin pain about 2 hours ago. Left torsion (no blood flow on [place], high riding) on ER [place]. Last meal yesterday. PMH [place].  Today "Heloise Purpura" is seen for emergent management of left torsion.   History reviewed. No pertinent past medical history.  Past Surgical History:  Procedure Laterality Date   HERNIA REPAIR     umbilical hernia baby    History reviewed. No pertinent family history. Social History:  reports that he has never smoked. He has never used smokeless tobacco. He reports current alcohol use. He reports current drug use. Drug: Marijuana.  Allergies: No Known Allergies  (Not in a hospital admission)   Results for orders placed or performed during the hospital encounter of 05/17/23 (from the past 48 hour(s))  CBC     Status: None   Collection Time: 05/17/23  5:29 AM  Result Value Ref Range   WBC 6.6 4.0 - 10.5 K/uL   RBC 4.81 4.22 - 5.81 MIL/uL   Hemoglobin 14.5 13.0 - 17.0 g/dL   HCT 28.4 13.2 - 44.0 %   MCV 88.1 80.0 - 100.0 fL   MCH 30.1 26.0 - 34.0 pg   MCHC 34.2 30.0 - 36.0 g/dL   RDW 10.2 72.5 - 36.6 %   Platelets 267 150 - 400 K/uL   nRBC 0.0 0.0 - 0.2 %    Comment: Performed at [facility], [address] [place], [place] [address]  Basic metabolic panel     Status: Abnormal   Collection Time: 05/17/23  5:29 AM  Result Value Ref Range   Sodium 136 135 - 145 mmol/L   Potassium 2.9 (L) 3.5 - 5.1 mmol/L   Chloride 98 98 - 111 mmol/L   CO2 26 22 - 32 mmol/L   Glucose, Bld 146 (H) 70 - 99 mg/dL    Comment: Glucose reference range applies only to samples taken after fasting for at least 8 hours.   BUN 17 6 - 20 mg/dL   Creatinine, Ser 7.42 0.61 - 1.24 mg/dL   Calcium 8.9 8.9 - 59.5 mg/dL   GFR, Estimated >63 >87 mL/min    Comment: (NOTE) Calculated using the CKD-EPI Creatinine Equation (2021)    Anion gap 12  5 - 15    Comment: Performed at [facility], [address] [place], [place] [address]  Type and screen [facility]     Status: None (Preliminary result)   Collection Time: 05/17/23  5:29 AM  Result Value Ref Range   ABO/RH(D) PENDING    Antibody Screen PENDING    Sample Expiration      05/20/2023,2359 Performed at [facility], [address], [place], [place] [address]    US SCROTUM W/DOPPLER  Result Date: 05/17/2023 CLINICAL DATA:  22 year old male with history of acute onset of left-sided testicular pain for the past 2 hours. EXAM: SCROTAL ULTRASOUND DOPPLER ULTRASOUND OF THE TESTICLES TECHNIQUE: Complete ultrasound examination of the testicles, epididymis, and other scrotal structures was performed. Color and spectral Doppler ultrasound were also utilized to evaluate blood flow to the testicles. COMPARISON:  No priors. FINDINGS: Right testicle Measurements: 6.3 x 2.8 x 3.7 cm. No mass or microlithiasis visualized. Left testicle Measurements: 4.4 x 2.2 x 3.5 cm. No mass or microlithiasis visualized. Right epididymis:  Normal in size and appearance. Left epididymis: Normal  in size. Normal blood flow could not be visualized. Hydrocele:  Trace bilateral hydroceles. Varicocele:  None visualized. Pulsed Doppler interrogation of both testes demonstrates normal low resistance arterial and venous waveforms to the right testicle. However, normal flow could not be visualized to the left testicle or low left epididymis. IMPRESSION: 1. Study is positive for left-sided testicular torsion. Lack of blood flow also noted to the left epididymis. Critical Value/emergent results were called by telephone at the time of interpretation on 05/17/2023 at 6:06 am to provider [facility], who verbally acknowledged these results. Electronically Signed   By: Trudie Reed M.D.   On: 05/17/2023 06:08    Review of Systems  Constitutional:  Negative for chills and fever.   Genitourinary:  Positive for scrotal swelling and testicular pain.  All other systems reviewed and are negative.   Blood pressure 131/86, pulse (!) 48, temperature 97.6 F (36.4 C), temperature source Oral, resp. rate 13, height 6' (1.829 m), weight 83.9 kg, SpO2 98%. Physical Exam Vitals reviewed.  Constitutional:      Comments: Pleasant, here with family.   HENT:     Head: Normocephalic.     Nose: Nose normal.  Eyes:     Pupils: Pupils are equal, round, and reactive to light.  Cardiovascular:     Rate and Rhythm: Normal rate.  Pulmonary:     Effort: Pulmonary effort is normal.  Abdominal:     General: Abdomen is flat.  Genitourinary:    Comments: Scrotal assyemtry with left testis high riding.  Musculoskeletal:        General: Normal range of motion.     Cervical back: Normal range of motion.  Skin:    General: Skin is warm.  Neurological:     General: No focal deficit present.     Mental Status: He is alert.  Psychiatric:        Mood and Affect: Mood normal.      Assessment/Plan  Dicussed natural history of torsion with very real risk of testis demise with continued ischemia and rec path of emergent scrotal exploration, LEFT testicular detorsion (given timecourse likely viable) and bilateral orchiopexy. Left orchiectomy only if grossly not viable. Risks, benefits, alternatives, expected peri-op course discussed.   [name]., MD 05/17/2023, 6:50 AM

## 2023-05-17 NOTE — Anesthesia Preprocedure Evaluation (Signed)
Anesthesia Evaluation  Patient identified by MRN, date of birth, ID band Patient awake    Reviewed: Allergy & Precautions, NPO status , Patient's Chart, lab work & pertinent test results  Airway Mallampati: II  TM Distance: >3 FB Neck ROM: full    Dental  (+) Dental Advidsory Given   Pulmonary neg pulmonary ROS   Pulmonary exam normal        Cardiovascular negative cardio ROS Normal cardiovascular exam     Neuro/Psych negative neurological ROS  negative psych ROS   GI/Hepatic negative GI ROS, Neg liver ROS,,,  Endo/Other  negative endocrine ROS    Renal/GU      Musculoskeletal   Abdominal   Peds  Hematology negative hematology ROS (+)   Anesthesia Other Findings Patient's potassium is 2.9. Due to the emergent nature of the case, will proceed. Patient is aware of risk and agrees to proceed.  History reviewed. No pertinent past medical history.  Past Surgical History: No date: HERNIA REPAIR     Comment:  umbilical hernia baby  BMI    Body Mass Index: 25.09 kg/m      Reproductive/Obstetrics negative OB ROS                             Anesthesia Physical Anesthesia Plan  ASA: 4 and emergent  Anesthesia Plan: General ETT and General   Post-op Pain Management:    Induction: Intravenous  PONV Risk Score and Plan: 2 and Ondansetron, Dexamethasone and Midazolam  Airway Management Planned: Oral ETT  Additional Equipment:   Intra-op Plan:   Post-operative Plan: Extubation in OR  Informed Consent: I have reviewed the patients History and Physical, chart, labs and discussed the procedure including the risks, benefits and alternatives for the proposed anesthesia with the patient or authorized representative who has indicated his/her understanding and acceptance.     Dental Advisory Given  Plan Discussed with: Anesthesiologist, CRNA and Surgeon  Anesthesia Plan Comments:  (Patient consented for risks of anesthesia including but not limited to:  - adverse reactions to medications - damage to eyes, teeth, lips or other oral mucosa - nerve damage due to positioning  - sore throat or hoarseness - Damage to heart, brain, nerves, lungs, other parts of body or loss of life  Patient voiced understanding and assent.)       Anesthesia Quick Evaluation

## 2023-05-18 ENCOUNTER — Encounter: Payer: Self-pay | Admitting: Urology

## 2023-05-20 LAB — TYPE AND SCREEN
ABO/RH(D): O POS
Antibody Screen: NEGATIVE

## 2023-12-07 ENCOUNTER — Encounter: Payer: Self-pay | Admitting: Family Medicine

## 2023-12-07 ENCOUNTER — Ambulatory Visit: Payer: Self-pay | Admitting: Family Medicine

## 2023-12-07 DIAGNOSIS — Z113 Encounter for screening for infections with a predominantly sexual mode of transmission: Secondary | ICD-10-CM

## 2023-12-07 LAB — HM HIV SCREENING LAB: HM HIV Screening: NEGATIVE

## 2023-12-07 NOTE — Progress Notes (Signed)
 [facility] Department STI clinic [address] [place] [address] Main phone: [phone]  STI screening visit  Subjective:  Abdifatah Colquhoun is a 23 y.o. male being seen today for an STI screening visit. The patient reports they do not have symptoms.    Patient has the following medical conditions:  Patient Active Problem List   Diagnosis Date Noted   Inguinal hernia, left 02/07/2013   Chief Complaint  Patient presents with   SEXUALLY TRANSMITTED DISEASE    HPI Patient reports to clinic for STI Testing- asymptomatic  See flowsheet for further details and programmatic requirements  Hyperlink available at the top of the signed note in blue.  Flow sheet content below:  Pregnancy Intention Screening Does the patient want to become pregnant in the next year?: Unsure Does the patient's partner want to become pregnant in the next year?: Unsure Would the patient like to discuss contraceptive options today?: N/A All Patients Anyone smoke around pt and/or pt's children?: Yes Anyone smoke inside pt's house?: No Anyone smoke inside car?: No Anyone smoke inside the workplace?: No Reason For STD Screen STD Screening: Is asymptomatic Have you ever had an STD?: No History of Antibiotic use in the past 2 weeks?: No STD Symptoms Denies all: Yes Risk Factors for Hep B Household, sexual, or needle sharing contact of a person infected with Hep B: No Sexual contact with a person who uses drugs not as prescribed?: No Currently or Ever used drugs not as prescribed: No HIV Positive: No PRep Patient: No Men who have sex with men: No Have Hepatitis C: No History of Incarceration: No History of Homeslessness?: No Anal sex following anal drug use?: No Risk Factors for Hep C Currently using drugs not as prescribed: No Sexual partner(s) currently using drugs as not prescribed: No History of drug use: No HIV Positive: No People with a history of  incarceration: No People born between the years of [birth year] and [birth year]: No Abuse History Has patient ever been abused physically?: No Has patient ever been abused sexually?: No Does patient feel they have a problem with Anxiety?: No Does patient feel they have a problem with Depression?: No Counseling Patient counseled to use condoms with all sex: Condoms declined RTC in 2-3 weeks for test results: Yes Clinic will call if test results abnormal before test result appt.: Yes Test results given to patient Patient counseled to use condoms with all sex: Condoms declined  Screening for MPX risk: Does the patient have an unexplained rash? No Is the patient MSM? No Does the patient endorse multiple sex partners or anonymous sex partners? No Did the patient have close or sexual contact with a person diagnosed with MPX? No Has the patient traveled outside the [place]  where MPX is endemic? No Is there a high clinical suspicion for MPX-- evidenced by one of the following No  -Unlikely to be chickenpox  -Lymphadenopathy  -Rash that present in same phase of evolution on any given body part  STI screening history: Last HIV test per patient/review of record was  Lab Results  Component Value Date   HMHIVSCREEN Negative - Validated 06/10/2022   No results found for: HIV  Last HEPC test per patient/review of record was  Lab Results  Component Value Date   HMHEPCSCREEN Negative-Validated 06/10/2022   No components found for: HEPC   Last HEPB test per patient/review of record was No components found for: HMHEPBSCREEN   Fertility: Does the patient or their partner desires  a pregnancy in the next year? No  Immunization History  Administered Date(s) Administered   HPV Quadrivalent 01/20/2012, 07/13/2012, 01/16/2013   Hepatitis A, Ped/Adol-2 Dose 01/20/2012, 12/20/2013   Hepatitis B, PED/ADOLESCENT 09-12-1999, 08/14/1999, 04/13/2000   MMR 07/29/2000, 07/16/2003   Meningococcal Conjugate 01/20/2012    Pneumococcal Conjugate PCV 7 09/19/1999, 11/13/1999, 02/11/2000, 07/29/2000   Tdap 01/20/2020, 03/07/2023   Varicella 01/17/2001, 01/20/2012    The following portions of the patient's history were reviewed and updated as appropriate: allergies, current medications, past medical history, past social history, past surgical history and problem list.  Objective:  There were no vitals filed for this visit.  Physical Exam Vitals and nursing note reviewed.  Constitutional:      Appearance: Normal appearance.  HENT:     Head: Normocephalic and atraumatic.     Mouth/Throat:     Mouth: Mucous membranes are moist.     Pharynx: No oropharyngeal exudate or posterior oropharyngeal erythema.   Eyes:     General:        Right eye: No discharge.        Left eye: No discharge.     Conjunctiva/sclera:     Right eye: Right conjunctiva is not injected. No exudate.    Left eye: Left conjunctiva is not injected. No exudate.  Pulmonary:     Effort: Pulmonary effort is normal.  Abdominal:     General: Abdomen is flat.     Palpations: Abdomen is soft. There is no hepatomegaly or mass.     Tenderness: There is no abdominal tenderness. There is no rebound.  Genitourinary:    Comments: Declined genital exam- asymptomatic Lymphadenopathy:     Cervical: No cervical adenopathy.     Upper Body:     Right upper body: No supraclavicular or axillary adenopathy.     Left upper body: No supraclavicular or axillary adenopathy.   Skin:    General: Skin is warm and dry.   Neurological:     Mental Status: He is alert and oriented to person, place, and time.      Assessment and Plan:  Angello Seydel is a 23 y.o. male presenting to the [facility] Department for STI screening  1. Screening for venereal disease (Primary)  - HIV [place] [facility] - Syphilis Serology, [place] [facility] - Chlamydia/GC NAA, Confirmation   Patient does not have STI symptoms Patient accepted the following screenings: urine  CT/GC, HIV, and RPR Patient meets criteria for HepB screening? No. Ordered? not applicable Patient meets criteria for HepC screening? No. Ordered? not applicable Recommended condom use with all sex Discussed importance of condom use for STI prevention  Treat positive test results per standing order. Discussed time line for [facility] results and that patient will be called with positive results and encouraged patient to call if he had not heard in 2 weeks Recommended repeat testing in 3 months with positive results. Recommended returning for continued or worsening symptoms.   Return if symptoms worsen or fail to improve, for STI screening.  No future appointments.  Verneta Bers, [place]

## 2023-12-09 LAB — CHLAMYDIA/GC NAA, CONFIRMATION
Chlamydia trachomatis, NAA: NEGATIVE
Neisseria gonorrhoeae, NAA: NEGATIVE

## 2023-12-23 ENCOUNTER — Encounter: Payer: Self-pay | Admitting: Family Medicine
